# Patient Record
Sex: Male | Born: 1965 | Race: White | Hispanic: No | Marital: Married | Smoking: Former smoker
Health system: Southern US, Community
[De-identification: ages and names within clinical notes are randomized; demographics above are authoritative.]

---

## 2004-08-07 ENCOUNTER — Emergency Department (HOSPITAL_COMMUNITY): Admission: EM | Admit: 2004-08-07 | Discharge: 2004-08-07 | Payer: Self-pay | Admitting: Emergency Medicine

## 2005-08-05 ENCOUNTER — Emergency Department (HOSPITAL_COMMUNITY): Admission: EM | Admit: 2005-08-05 | Discharge: 2005-08-05 | Payer: Self-pay | Admitting: Emergency Medicine

## 2008-01-12 ENCOUNTER — Ambulatory Visit: Payer: Self-pay | Admitting: Family Medicine

## 2008-01-12 ENCOUNTER — Encounter: Admission: RE | Admit: 2008-01-12 | Discharge: 2008-01-12 | Payer: Self-pay | Admitting: Family Medicine

## 2010-11-16 ENCOUNTER — Inpatient Hospital Stay (INDEPENDENT_AMBULATORY_CARE_PROVIDER_SITE_OTHER)
Admission: RE | Admit: 2010-11-16 | Discharge: 2010-11-16 | Disposition: A | Discharge status: Home / Self Care | Payer: BC Managed Care – PPO | Source: Ambulatory Visit | Attending: Family Medicine | Admitting: Family Medicine

## 2010-11-16 DIAGNOSIS — L738 Other specified follicular disorders: Secondary | ICD-10-CM

## 2010-11-16 DIAGNOSIS — L989 Disorder of the skin and subcutaneous tissue, unspecified: Secondary | ICD-10-CM

## 2011-03-22 ENCOUNTER — Inpatient Hospital Stay (INDEPENDENT_AMBULATORY_CARE_PROVIDER_SITE_OTHER)
Admission: RE | Admit: 2011-03-22 | Discharge: 2011-03-22 | Disposition: A | Discharge status: Home / Self Care | Payer: BC Managed Care – PPO | Source: Ambulatory Visit | Attending: Family Medicine | Admitting: Family Medicine

## 2011-03-22 DIAGNOSIS — J069 Acute upper respiratory infection, unspecified: Secondary | ICD-10-CM

## 2012-11-02 DIAGNOSIS — J302 Other seasonal allergic rhinitis: Secondary | ICD-10-CM | POA: Insufficient documentation

## 2013-08-20 DIAGNOSIS — F1420 Cocaine dependence, uncomplicated: Secondary | ICD-10-CM | POA: Insufficient documentation

## 2013-08-20 DIAGNOSIS — F1110 Opioid abuse, uncomplicated: Secondary | ICD-10-CM | POA: Insufficient documentation

## 2013-08-20 DIAGNOSIS — M1990 Unspecified osteoarthritis, unspecified site: Secondary | ICD-10-CM | POA: Insufficient documentation

## 2013-08-20 DIAGNOSIS — R0600 Dyspnea, unspecified: Secondary | ICD-10-CM | POA: Insufficient documentation

## 2013-09-08 DIAGNOSIS — G2581 Restless legs syndrome: Secondary | ICD-10-CM | POA: Insufficient documentation

## 2013-09-08 DIAGNOSIS — F339 Major depressive disorder, recurrent, unspecified: Secondary | ICD-10-CM | POA: Insufficient documentation

## 2016-12-16 ENCOUNTER — Emergency Department
Admission: EM | Admit: 2016-12-16 | Discharge: 2016-12-16 | Disposition: A | Discharge status: Home / Self Care | Payer: Managed Care, Other (non HMO) | Source: Home / Self Care | Attending: Family Medicine | Admitting: Family Medicine

## 2016-12-16 ENCOUNTER — Encounter: Payer: Self-pay | Admitting: Emergency Medicine

## 2016-12-16 DIAGNOSIS — N492 Inflammatory disorders of scrotum: Secondary | ICD-10-CM | POA: Diagnosis not present

## 2016-12-16 MED ORDER — DOXYCYCLINE HYCLATE 100 MG PO CAPS: 100.0000 mg | ORAL_CAPSULE | Freq: Two times a day (BID) | ORAL | 0 refills | Status: DC

## 2016-12-16 NOTE — ED Notes (Signed)
Patient placed in gown

## 2016-12-16 NOTE — Discharge Instructions (Signed)
°  Please take antibiotics as prescribed and be sure to complete entire course even if you start to feel better to ensure infection does not come back. ° °

## 2016-12-16 NOTE — ED Triage Notes (Signed)
Patient presents to [facility] with C/O a cyst in the right groin area times 1 week, he denies fever, or other problems.

## 2016-12-16 NOTE — ED Provider Notes (Signed)
CSN: [id number]     Arrival date & time 12/16/16  1110 History   First MD Initiated Contact with Patient 12/16/16 1136     Chief Complaint  Patient presents with  . Cyst   (Consider location/radiation/quality/duration/timing/severity/associated sxs/prior Treatment) HPI  Duane Hernandez is a 51 y.o. male presenting to UC with c/o 1 week gradually worsening painful cyst on Right side of scrotum.  He notes he has had a small hard nodule in same area for many months but only within the last week has it become painful and felt like it is getting bigger.  Pain is aching and sore, 2/10.  Denies fever, chills, n/v/d.    History reviewed. No pertinent past medical history. History reviewed. No pertinent surgical history. History reviewed. No pertinent family history. Social History  Substance Use Topics  . Smoking status: Current Every Day Smoker  . Smokeless tobacco: Never Used  . Alcohol use No    Review of Systems  Constitutional: Negative for chills and fever.  Gastrointestinal: Negative for diarrhea, nausea and vomiting.  Genitourinary: Positive for scrotal swelling. Negative for discharge, penile pain and testicular pain.  Skin: Negative for wound. Color change:  unsure, he cannot see area on his scrotum.    Allergies  Patient has no allergy information on record.  Home Medications   Prior to Admission medications   Medication Sig Start Date End Date Taking? Authorizing Provider  doxycycline (VIBRAMYCIN) 100 MG capsule Take 1 capsule (100 mg total) by mouth 2 (two) times daily. One po bid x 7 days 12/16/16   Lurene ShadowPhelps, Dannelle Rhymes O, PA-C   Meds Ordered and Administered this Visit  Medications - No data to display  BP 117/77 (BP Location: Left Arm)   Pulse 61   Temp 98.4 F (36.9 C) (Oral)   Resp 16   Ht 6\' 1"  (1.854 m)   Wt 190 lb 8 oz (86.4 kg)   SpO2 96%   BMI 25.13 kg/m  No data found.   Physical Exam  Constitutional: He is oriented to person, place, and time. He appears  well-developed and well-nourished.  HENT:  Head: Normocephalic and atraumatic.  Eyes: EOM are normal.  Neck: Normal range of motion.  Cardiovascular: Normal rate.   Pulmonary/Chest: Effort normal.  Genitourinary: Right testis shows mass and tenderness.     Genitourinary Comments: Chaperoned exam. Quarter sized area of erythema and induration with centralized white head and fluctuance. No active bleeding or drainage. No red streaking.   Musculoskeletal: Normal range of motion.  Neurological: He is alert and oriented to person, place, and time.  Skin: Skin is warm and dry.  Psychiatric: He has a normal mood and affect. His behavior is normal.  Nursing note and vitals reviewed.   Urgent Care Course     .Marland Kitchen.Incision and Drainage Date/Time: 12/16/2016 12:46 PM Performed by: Lurene ShadowPHELPS, Elisabet Gutzmer O Authorized by: Donna ChristenBEESE, STEPHEN A   Consent:    Consent obtained:  Verbal   Consent given by:  Patient   Risks discussed:  Bleeding, incomplete drainage and pain   Alternatives discussed:  Delayed treatment and alternative treatment (antibiotics and warm compress only) Location:    Type:  Abscess   Size:  2.5   Location:  Anogenital   Anogenital location:  Scrotal wall (Right side) Pre-procedure details:    Skin preparation:  Betadine Anesthesia (see MAR for exact dosages):    Anesthesia method:  Local infiltration   Local anesthetic:  Lidocaine 1% w/o epi Procedure type:  Complexity:  Simple Procedure details:    Incision types:  Single straight   Incision depth:  Subcutaneous   Scalpel blade:  11   Wound management:  Probed and deloculated and irrigated with saline   Drainage:  Purulent and bloody   Drainage amount:  Moderate   Wound treatment:  Wound left open   Packing materials:  None Post-procedure details:    Patient tolerance of procedure:  Tolerated well, no immediate complications   Wound culture sent to lab  Labs Review Labs Reviewed  WOUND CULTURE    Imaging  Review No results found.   MDM   1. Abscess of scrotal wall    Abscess of Right side scrotum  I&D successfully performed w/o immediate complication Culture sent  Rx: Doxycycline Pt declined tramadol   May take ibuprofen this evening if pain worsening. Encouraged warm compresses F/u with PCP as he may need referral to dermatologist or general surgeon if abscess originated from cyst to help prevent recurrence.     Lurene Shadow, PA-C 12/16/16 1249

## 2016-12-18 ENCOUNTER — Telehealth: Payer: Self-pay | Admitting: Emergency Medicine

## 2016-12-18 LAB — WOUND CULTURE: Gram Stain: NONE SEEN

## 2016-12-26 ENCOUNTER — Encounter: Payer: Self-pay | Admitting: Osteopathic Medicine

## 2016-12-26 ENCOUNTER — Ambulatory Visit (INDEPENDENT_AMBULATORY_CARE_PROVIDER_SITE_OTHER): Payer: Managed Care, Other (non HMO) | Admitting: Osteopathic Medicine

## 2016-12-26 VITALS — BP 120/72 | HR 59 | Ht 73.0 in | Wt 186.0 lb

## 2016-12-26 DIAGNOSIS — R5383 Other fatigue: Secondary | ICD-10-CM

## 2016-12-26 DIAGNOSIS — F1110 Opioid abuse, uncomplicated: Secondary | ICD-10-CM | POA: Diagnosis not present

## 2016-12-26 DIAGNOSIS — Z8619 Personal history of other infectious and parasitic diseases: Secondary | ICD-10-CM

## 2016-12-26 DIAGNOSIS — F1420 Cocaine dependence, uncomplicated: Secondary | ICD-10-CM | POA: Diagnosis not present

## 2016-12-26 DIAGNOSIS — L723 Sebaceous cyst: Secondary | ICD-10-CM

## 2016-12-26 LAB — LIPID PANEL
CHOL/HDL RATIO: 5.3 ratio — AB (ref <5.0)
Cholesterol: 205 mg/dL — ABNORMAL HIGH (ref <200)
HDL: 39 mg/dL — AB (ref >40)
LDL CALC: 149 mg/dL — AB (ref <100)
TRIGLYCERIDES: 87 mg/dL (ref <150)
VLDL: 17 mg/dL (ref <30)

## 2016-12-26 LAB — COMPLETE METABOLIC PANEL WITH GFR
ALT: 30 U/L (ref 9–46)
AST: 22 U/L (ref 10–35)
Albumin: 4.6 g/dL (ref 3.6–5.1)
Alkaline Phosphatase: 48 U/L (ref 40–115)
BILIRUBIN TOTAL: 0.8 mg/dL (ref 0.2–1.2)
BUN: 17 mg/dL (ref 7–25)
CO2: 27 mmol/L (ref 20–31)
Calcium: 9.3 mg/dL (ref 8.6–10.3)
Chloride: 105 mmol/L (ref 98–110)
Creat: 0.91 mg/dL (ref 0.70–1.33)
Glucose, Bld: 92 mg/dL (ref 65–99)
POTASSIUM: 4.3 mmol/L (ref 3.5–5.3)
Sodium: 141 mmol/L (ref 135–146)
TOTAL PROTEIN: 7.2 g/dL (ref 6.1–8.1)

## 2016-12-26 LAB — TSH: TSH: 1.89 mIU/L (ref 0.40–4.50)

## 2016-12-26 LAB — CBC
HEMATOCRIT: 47.1 % (ref 38.5–50.0)
HEMOGLOBIN: 16.1 g/dL (ref 13.2–17.1)
MCH: 33.3 pg — ABNORMAL HIGH (ref 27.0–33.0)
MCHC: 34.2 g/dL (ref 32.0–36.0)
MCV: 97.5 fL (ref 80.0–100.0)
MPV: 12 fL (ref 7.5–12.5)
PLATELETS: 172 10*3/uL (ref 140–400)
RBC: 4.83 MIL/uL (ref 4.20–5.80)
RDW: 13.1 % (ref 11.0–15.0)
WBC: 5.9 10*3/uL (ref 3.8–10.8)

## 2016-12-26 MED ORDER — DOXYCYCLINE HYCLATE 100 MG PO CAPS: 100.0000 mg | ORAL_CAPSULE | Freq: Two times a day (BID) | ORAL | 0 refills | Status: DC

## 2016-12-26 MED ORDER — DOXYCYCLINE HYCLATE 100 MG PO TABS: 100.0000 mg | ORAL_TABLET | Freq: Two times a day (BID) | ORAL | 0 refills | Status: DC

## 2016-12-26 NOTE — Patient Instructions (Signed)
Plan:  Keep area clean and dry! Wear maxi pad right up against the skin to promote air circulation and to avoid moisture accumulation  The packing should come out in 2-3 days. If it comes out on its own before then, don't worry about it  We are sending you to a urologist who can remove the cyst entirely and prevent this from coming back  Fill the antibiotics and call us if she skin becomes red/warm, if increased pus/drainage, of fever or other concerns

## 2016-12-26 NOTE — Progress Notes (Signed)
HPI: Duane Hernandez is a 51 y.o. male  who presents to [facility] today, 12/26/16,  for chief complaint of:  Chief Complaint  Patient presents with  . Establish Care    Cyst  . Context: recent I&D at urgent care 12/16/16 . Location: R scrotum  . Quality: starting to swell up and drain again . Duration: was present many months but worsening . Modifying factors: Doxy and ibuprofen . Assoc signs/symptoms: no fever  Hx Hep C - has gone through treatment for this. Would like testing to confirm that he has cleared the virus  Fatigue - would like lab work done   Fasting - we can do labs today   Smoker - recently underwent some kind of laster therapy for quitting, still struggling to stop. He is not interested in pharmacologic therapy such as Chantix or Wellbutrin.    Past medical, surgical, social and family history reviewed: Patient Active Problem List   Diagnosis Date Noted  . Restless leg syndrome 09/08/2013  . Major depression, recurrent (HCC) 09/08/2013  . Arthritis 08/20/2013  . PND (paroxysmal nocturnal dyspnea) 08/20/2013  . Opiate abuse, episodic 08/20/2013  . Cocaine use disorder, severe, dependence (HCC) 08/20/2013  . Seasonal allergies 11/02/2012   No past surgical history on file. Social History  Substance Use Topics  . Smoking status: Current Every Day Smoker  . Smokeless tobacco: Never Used  . Alcohol use No   No family history on file.   Current medication list and allergy/intolerance information reviewed:   Current Outpatient Prescriptions  Medication Sig Dispense Refill  . doxycycline (VIBRAMYCIN) 100 MG capsule Take 1 capsule (100 mg total) by mouth 2 (two) times daily. One po bid x 7 days 14 capsule 0   No current facility-administered medications for this visit.    Not on File    Review of Systems:  Constitutional:  No  fever, no chills, No recent illness, No unintentional weight changes. No significant  fatigue.   HEENT: No  headache, no vision change,  Cardiac: No  chest pain, No  pressure, No palpitations  Respiratory:  No  shortness of breath. No  Cough  Gastrointestinal: No  abdominal pain, No  nausea, No  vomiting,  No  blood in stool, No  diarrhea, No  constipation   Musculoskeletal: No new myalgia/arthralgia  Genitourinary: No  incontinence, No  abnormal genital bleeding, No abnormal genital discharge  Skin: No  Rash, +other wounds/concerning lesions  Hem/Onc: No  easy bruising/bleeding, No  abnormal lymph node   Endocrine: No cold intolerance,  No heat intolerance. No polyuria/polydipsia/polyphagia   Neurologic: No  weakness, No  dizziness  Psychiatric: No  concerns with depression, No  concerns with anxiety, No sleep problems, No mood problems  Exam:  BP 120/72   Pulse (!) 59   Ht 6\' 1"  (1.854 m)   Wt 186 lb (84.4 kg)   BMI 24.54 kg/m   Constitutional: VS see above. General Appearance: alert, well-developed, well-nourished, NAD  Eyes: Normal lids and conjunctive, non-icteric sclera  Ears, Nose, Mouth, Throat: MMM, Normal external inspection ears/nares/mouth/lips/gums.  Neck: No masses, trachea midline.   Gastrointestinal: Nontender, no masses.   Musculoskeletal: Gait normal. No clubbing/cyanosis of digits.   Neurological: Normal balance/coordination. No tremor.   Skin: warm, dry, intact. Mild drainage of thick whitish cheesy material from cystlike area at junction of right scrotum/right groin.  Psychiatric: Normal judgment/insight. Normal mood and affect. Oriented x3.   Records reviewed from urgent  care, wound culture at that time demonstrated corynebacterium species, no susceptibility testing available  PRE-OP DIAGNOSIS: Abscess versus sebaceous cyst POST-OP DIAGNOSIS: Likely sebaceous cyst PROCEDURE: incision and drainage  Performing Physician: Lyn HollingsheadAlexander    PROCEDURE:   Patient informed consent obtained verbally after discussion of risks  (including but not limited to pain, infection, bleeding, damage to surrounding tissues, incomplete evacuation/treatment of infection, recurrence)  and benefits (adequate treatment and diagnosis, relief of pain). All questions answered prior to procedure.   A timeout protocol was performed prior to initiating the procedure.  The area was prepared and draped in the usual, sterile manner. The site was anesthetized with 2 cc 1% lidocaine. A linear incision along the local skin lines was made and sebaceous material expressed. The cyst was explored thoroughly and sequestered pockets were evacuated. Bleeding was minimal.   Packing: iodoform packing   Followup: The patient tolerated the procedure well without complications. Standard post-procedure care is explained and return precautions are given, patient was provided with printed information & instructions.    ASSESSMENT/PLAN:   Sebaceous cyst - Plan: Ambulatory referral to Urology  History of hepatitis C - Plan: Hepatitis C RNA quantitative  Other fatigue - Plan: CBC, COMPLETE METABOLIC PANEL WITH GFR, Lipid panel, TSH  Cocaine use disorder, severe, dependence (HCC)  Opiate abuse, episodic    Patient Instructions  Plan:  Keep area clean and dry! Wear maxi pad right up against the skin to promote air circulation and to avoid moisture accumulation  The packing should come out in 2-3 days. If it comes out on its own before then, don't worry about it  We are sending you to a urologist who can remove the cyst entirely and prevent this from coming back  Fill the antibiotics and call us if she skin becomes red/warm, if increased pus/drainage, of fever or other concerns    Visit summary with medication list and pertinent instructions was printed for patient to review. All questions at time of visit were answered - patient instructed to contact office with any additional concerns. ER/RTC precautions were reviewed with the patient. Follow-up  plan: Return in about 6 weeks (around 02/06/2017) for annual physical and discuss lab results .

## 2016-12-28 LAB — HEPATITIS C RNA QUANTITATIVE
HCV Quantitative Log: <1.18 Log IU/mL
HCV Quantitative: <15 IU/mL

## 2017-01-26 ENCOUNTER — Emergency Department
Admission: EM | Admit: 2017-01-26 | Discharge: 2017-01-26 | Disposition: A | Discharge status: Home / Self Care | Payer: Managed Care, Other (non HMO) | Source: Home / Self Care | Attending: Family Medicine | Admitting: Family Medicine

## 2017-01-26 ENCOUNTER — Encounter: Payer: Self-pay | Admitting: Emergency Medicine

## 2017-01-26 DIAGNOSIS — S20469A Insect bite (nonvenomous) of unspecified back wall of thorax, initial encounter: Secondary | ICD-10-CM | POA: Diagnosis not present

## 2017-01-26 DIAGNOSIS — L089 Local infection of the skin and subcutaneous tissue, unspecified: Secondary | ICD-10-CM | POA: Diagnosis not present

## 2017-01-26 DIAGNOSIS — W57XXXA Bitten or stung by nonvenomous insect and other nonvenomous arthropods, initial encounter: Secondary | ICD-10-CM | POA: Diagnosis not present

## 2017-01-26 MED ORDER — DOXYCYCLINE HYCLATE 100 MG PO CAPS: 100.0000 mg | ORAL_CAPSULE | Freq: Two times a day (BID) | ORAL | 0 refills | Status: AC

## 2017-01-26 NOTE — Discharge Instructions (Signed)
Apply heating pad 2 or 3 times daily.

## 2017-01-26 NOTE — ED Provider Notes (Signed)
[facility]    CSN: [id number] Arrival date & time: 01/26/17  1604     History   Chief Complaint Chief Complaint  Patient presents with  . Insect Bite    HPI Duane Hernandez is a 51 y.o. male.   Two days ago while at work, patient felt a stinging sensation on his back suggestive of an insect bite.  Since then he has had a persistent and expanding area of itching/redness on his back.  He feels well otherwise.    The history is provided by the patient.  Rash  Location: back. Quality: burning, dryness, itchiness, painful, redness and swelling   Quality: not blistering, not bruising, not draining, not peeling, not scaling and not weeping   Pain details:    Quality:  Aching and burning   Severity:  Mild   Onset quality:  Gradual   Duration:  2 days   Timing:  Constant   Progression:  Worsening Severity:  Mild Onset quality:  Sudden Duration:  2 days Timing:  Constant Progression:  Worsening Chronicity:  New Context: insect bite/sting   Relieved by:  Nothing Worsened by:  Nothing Ineffective treatments:  None tried Associated symptoms: no abdominal pain, no diarrhea, no fatigue, no fever, no headaches, no induration, no joint pain, no myalgias and no nausea     History reviewed. No pertinent past medical history.  Patient Active Problem List   Diagnosis Date Noted  . Restless leg syndrome 09/08/2013  . Major depression, recurrent (HCC) 09/08/2013  . Arthritis 08/20/2013  . PND (paroxysmal nocturnal dyspnea) 08/20/2013  . Opiate abuse, episodic 08/20/2013  . Cocaine use disorder, severe, dependence (HCC) 08/20/2013  . Seasonal allergies 11/02/2012    History reviewed. No pertinent surgical history.     Home Medications    Prior to Admission medications   Medication Sig Start Date End Date Taking? Authorizing Provider  doxycycline (VIBRAMYCIN) 100 MG capsule Take 1 capsule (100 mg total) by mouth 2 (two) times daily. Take with food. 01/26/17  02/02/17  Lattie Haw, MD    Family History Family History  Problem Relation Age of Onset  . Hyperlipidemia Mother   . Stroke Mother     Social History Social History  Substance Use Topics  . Smoking status: Current Every Day Smoker  . Smokeless tobacco: Never Used  . Alcohol use No     Allergies   Patient has no known allergies.   Review of Systems Review of Systems  Constitutional: Negative for fatigue and fever.  Gastrointestinal: Negative for abdominal pain, diarrhea and nausea.  Musculoskeletal: Negative for arthralgias and myalgias.  Skin: Positive for rash.  Neurological: Negative for headaches.  All other systems reviewed and are negative.    Physical Exam Triage Vital Signs ED Triage Vitals [01/26/17 1637]  Enc Vitals Group     BP 122/84     Pulse Rate (!) 51     Resp      Temp 98.1 F (36.7 C)     Temp Source Oral     SpO2 98 %     Weight 185 lb 12 oz (84.3 kg)     Height 6\' 1"  (1.854 m)     Head Circumference      Peak Flow      Pain Score 2     Pain Loc      Pain Edu?      Excl. in GC?    No data found.   Updated Vital  Signs BP 122/84 (BP Location: Left Arm)   Pulse (!) 51   Temp 98.1 F (36.7 C) (Oral)   Ht 6\' 1"  (1.854 m)   Wt 185 lb 12 oz (84.3 kg)   SpO2 98%   BMI 24.51 kg/m   Visual Acuity Right Eye Distance:   Left Eye Distance:   Bilateral Distance:    Right Eye Near:   Left Eye Near:    Bilateral Near:     Physical Exam  Constitutional: He appears well-developed and well-nourished. No distress.  HENT:  Head: Normocephalic.  Right Ear: External ear normal.  Left Ear: External ear normal.  Nose: Nose normal.  Mouth/Throat: Oropharynx is clear and moist.  Eyes: Pupils are equal, round, and reactive to light.  Neck: Neck supple.  Cardiovascular: Normal heart sounds.   Pulmonary/Chest: Breath sounds normal.      Posterior chest has 4cm diameter erythematous macular eruption as noted on diagram.  Mild  tenderness to palpation.  Lymphadenopathy:    He has no cervical adenopathy.  Neurological: He is alert.  Skin: Skin is warm and dry.  Nursing note and vitals reviewed.    UC Treatments / Results  Labs (all labs ordered are listed, but only abnormal results are displayed) Labs Reviewed - No data to display  EKG  EKG Interpretation None       Radiology No results found.  Procedures Procedures (including critical care time)  Medications Ordered in UC Medications - No data to display   Initial Impression / Assessment and Plan / UC Course  I have reviewed the triage vital signs and the nursing notes.  Pertinent labs & imaging results that were available during my care of the patient were reviewed by me and considered in my medical decision making (see chart for details).    Begin empiric doxycycline 100mg  BID. Apply heating pad 2 or 3 times daily. Followup with Family Doctor if not improved in 6 days.    Final Clinical Impressions(s) / UC Diagnoses   Final diagnoses:  Infected insect bite of back, initial encounter    New Prescriptions New Prescriptions   DOXYCYCLINE (VIBRAMYCIN) 100 MG CAPSULE    Take 1 capsule (100 mg total) by mouth 2 (two) times daily. Take with food.         Lattie Haw, MD 02/03/17 [phone]

## 2017-01-26 NOTE — ED Triage Notes (Signed)
Patient complaining of possible spider bite on back on Thursday while at work.  The area is red and some swelling, some pain.

## 2017-02-06 ENCOUNTER — Encounter: Payer: Managed Care, Other (non HMO) | Admitting: Osteopathic Medicine

## 2018-08-06 ENCOUNTER — Telehealth: Payer: Self-pay | Admitting: Osteopathic Medicine

## 2018-08-06 NOTE — Telephone Encounter (Signed)
Patient was seen at the [facility] in [place] on 08/03/2018 for chest pain. States that his pulse rate was low. Would like to come in for this as soon as possible. States that he wanted to get referred for a stress test. Please advise.

## 2018-08-06 NOTE — Telephone Encounter (Signed)
If he's concerned about chest pain, this is something he should strongly consider going to the ER about. Will get him in when we can here but we are not an emergency clinic

## 2018-08-07 ENCOUNTER — Ambulatory Visit: Payer: Managed Care, Other (non HMO) | Admitting: Osteopathic Medicine

## 2018-08-07 ENCOUNTER — Ambulatory Visit (INDEPENDENT_AMBULATORY_CARE_PROVIDER_SITE_OTHER): Payer: 59

## 2018-08-07 ENCOUNTER — Encounter: Payer: Self-pay | Admitting: Osteopathic Medicine

## 2018-08-07 VITALS — BP 119/73 | HR 53 | Temp 98.0°F | Wt 198.1 lb

## 2018-08-07 DIAGNOSIS — S43421A Sprain of right rotator cuff capsule, initial encounter: Secondary | ICD-10-CM | POA: Diagnosis not present

## 2018-08-07 DIAGNOSIS — M25511 Pain in right shoulder: Secondary | ICD-10-CM

## 2018-08-07 DIAGNOSIS — S46311A Strain of muscle, fascia and tendon of triceps, right arm, initial encounter: Secondary | ICD-10-CM | POA: Diagnosis not present

## 2018-08-07 DIAGNOSIS — M19011 Primary osteoarthritis, right shoulder: Secondary | ICD-10-CM

## 2018-08-07 MED ORDER — MELOXICAM 15 MG PO TABS: 15.0000 mg | ORAL_TABLET | Freq: Every day | ORAL | 0 refills | Status: DC

## 2018-08-07 MED ORDER — PREDNISONE 50 MG PO TABS: ORAL_TABLET | ORAL | 0 refills | Status: AC

## 2018-08-07 NOTE — Patient Instructions (Addendum)
If shoulder/arm is not better or if it gets worse, I would recommend follow-up with one of our sports medicine specialists (Dr Denyse Amass or Dr. Cherylann Parr Dr. Karie Schwalbe) for further evaluation in 2-4 weeks. Just call our office and ask for an appointment for sports medicine!   In the meantime, let's try anti-inflammatory Meloxicam and also a steroid burst w/ Prednisone.   Due for annual physical sometime this year, come see me!

## 2018-08-07 NOTE — Progress Notes (Signed)
HPI: Duane Hernandez is a 53 y.o. male who  has no past medical history on file.  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      At today's visit 08/07/18 ... PMH, PSH, FH reviewed and updated as needed.  Current medication list and allergy/intolerance hx reviewed and updated as needed. (See remainder of HPI, ROS, Phys Exam below)         ASSESSMENT/PLAN: The primary encounter diagnosis was Acute pain of right shoulder. Diagnoses of Sprain of right rotator cuff capsule, initial encounter and Strain of right triceps, initial encounter were also pertinent to this visit.   Orders Placed This Encounter  Procedures  . DG Shoulder Right   Declined XR other areas of concern     Meds ordered this encounter  Medications  . meloxicam (MOBIC) 15 MG tablet    Sig: Take 1 tablet (15 mg total) by mouth daily. Daily for 7 days then as needed after that    Dispense:  30 tablet    Refill:  0  . predniSONE (DELTASONE) 50 MG tablet    Sig: One tab PO daily for 5 days.    Dispense:  5 tablet    Refill:  0    Patient Instructions  If shoulder/arm is not better or if it gets worse, I would recommend follow-up with one of our sports medicine specialists (Dr Denyse Amass or Dr. Cherylann Parr Dr. Karie Schwalbe) for further evaluation in 2-4 weeks.  Just call our office and ask for an appointment for sports medicine!   In the meantime, let's try anti-inflammatory Meloxicam and also a steroid burst w/ Prednisone.   Due for annual physical sometime this year, come see me!       Follow-up plan: Return if symptoms worsen or fail to improve - see sports medicine .                                                 ################################################# ################################################# ################################################# #################################################    Current Meds  Medication Sig  . B Complex Vitamins (VITAMIN B-COMPLEX) TABS Take by mouth.  . IBUPROFEN IB PO Take by mouth.  Marland Kitchen KRILL OIL PO Take by mouth.    Allergies  Allergen Reactions  . Lithium     Other reaction(s): Other sedation  . Bupropion Anxiety       Review of Systems:  Constitutional: No recent illness  HEENT: No  headache, no vision change  Cardiac: +chest pain as per HPI, No  pressure, No palpitations  Respiratory:  No  shortness of breath. No  Cough  Gastrointestinal: No  abdominal pain  Musculoskeletal: +new myalgia/arthralgia  Skin: No  Rash  Neurologic: No  weakness, No  Dizziness  Exam:  BP 119/73 (BP Location: Left Arm, Patient Position: Sitting, Cuff Size: Normal)   Pulse (!) 53   Temp 98 F (36.7 C) (Oral)   Wt 198 lb 1.6 oz (89.9 kg)   SpO2 98%   BMI 26.14 kg/m   Constitutional: VS see above. General Appearance: alert, well-developed, well-nourished, NAD  Eyes: Normal lids and conjunctive, non-icteric sclera  Ears, Nose, Mouth, Throat: MMM, Normal external inspection ears/nares/mouth/lips/gums.  Neck: No masses, trachea midline.   Respiratory: Normal respiratory effort. no wheeze, no rhonchi, no rales  Cardiovascular: S1/S2 normal, no murmur, no rub/gallop auscultated. RRR.   Musculoskeletal: Gait normal.  Symmetric and independent movement of all extremities  R shoulder Neg drop arm, neg post apprehension, (+) anterior apprehension, neg cross-arm, neg speeds/yergason/neer  Neurological: Normal balance/coordination. No tremor.  Skin: warm, dry, intact.   Psychiatric: Normal judgment/insight. Normal mood and affect. Oriented x3.       Visit summary with medication list and pertinent instructions was printed for patient to review, patient was advised to alert [place] if any updates are needed. All questions at time of visit were answered - patient instructed to contact office with any additional concerns. ER/RTC precautions were reviewed with the patient and understanding verbalized.     Please note: voice recognition software was used to produce this document, and typos may escape review. Please contact Dr. Lyn Hollingshead for any needed clarifications.    Follow up plan: Return if symptoms worsen or fail to improve - see sports medicine .

## 2018-08-07 NOTE — Telephone Encounter (Signed)
Patient scheduled.

## 2018-08-08 ENCOUNTER — Ambulatory Visit: Payer: Managed Care, Other (non HMO) | Admitting: Osteopathic Medicine

## 2018-09-03 ENCOUNTER — Other Ambulatory Visit: Payer: Self-pay | Admitting: Osteopathic Medicine

## 2018-09-04 ENCOUNTER — Other Ambulatory Visit: Payer: Self-pay | Admitting: Osteopathic Medicine

## 2018-09-17 ENCOUNTER — Encounter: Payer: Self-pay | Admitting: Osteopathic Medicine

## 2019-03-01 ENCOUNTER — Emergency Department
Admission: EM | Admit: 2019-03-01 | Discharge: 2019-03-01 | Disposition: A | Discharge status: Home / Self Care | Payer: 59 | Source: Home / Self Care | Attending: Family Medicine | Admitting: Family Medicine

## 2019-03-01 ENCOUNTER — Other Ambulatory Visit: Payer: Self-pay

## 2019-03-01 DIAGNOSIS — Z23 Encounter for immunization: Secondary | ICD-10-CM

## 2019-03-01 DIAGNOSIS — S91332A Puncture wound without foreign body, left foot, initial encounter: Secondary | ICD-10-CM

## 2019-03-01 MED ORDER — TETANUS-DIPHTH-ACELL PERTUSSIS 5-2.5-18.5 LF-MCG/0.5 IM SUSP
0.5000 mL | Freq: Once | INTRAMUSCULAR | Status: AC
  Administered 2019-03-01: 0.5 mL via INTRAMUSCULAR

## 2019-03-01 MED ORDER — DOXYCYCLINE HYCLATE 100 MG PO CAPS: 100.0000 mg | ORAL_CAPSULE | Freq: Two times a day (BID) | ORAL | 0 refills | Status: DC

## 2019-03-01 NOTE — Discharge Instructions (Addendum)
Change dressing daily and apply Bacitracin ointment to wound until healed.  Keep wound clean and dry.  Return for any signs of infection (or follow-up with family doctor):  Increasing redness, swelling, pain, heat, drainage, etc.   May take Ibuprofen 200mg , 4 tabs every 8 hours with food, if needed.

## 2019-03-01 NOTE — ED Triage Notes (Signed)
Stepped on rusty nail about an hour ago. Wasn't sure if Tdap was up to date. Bleeding controlled.

## 2019-03-01 NOTE — ED Provider Notes (Signed)
[facility]    CSN: [id number] Arrival date & time: 03/01/19  1433      History   Chief Complaint Chief Complaint  Patient presents with  . Stepped on rusty nail    LT foot    HPI Duane Hernandez is a 53 y.o. male.   Patient stepped on a rusty nail about three hours ago.  The nail penetrated his shoe and punctured the plantar surface of his foot.  He had no difficulty removing the nail.  He does not remember his last Tdap.  The history is provided by the patient.  Foot Injury Location:  Foot Time since incident:  3 hours Injury: yes   Mechanism of injury comment:  Puncture wound Foot location:  L foot Pain details:    Quality:  Aching   Radiates to:  Does not radiate   Severity:  Mild   Onset quality:  Sudden   Duration:  3 hours   Timing:  Constant   Progression:  Unchanged Chronicity:  New Foreign body present:  No foreign bodies Tetanus status:  Out of date Prior injury to area:  No Relieved by:  None tried Worsened by:  Bearing weight Ineffective treatments:  None tried Associated symptoms: no decreased ROM, no numbness, no swelling and no tingling     History reviewed. No pertinent past medical history.  Patient Active Problem List   Diagnosis Date Noted  . Restless leg syndrome 09/08/2013  . Major depression, recurrent (HCC) 09/08/2013  . Arthritis 08/20/2013  . PND (paroxysmal nocturnal dyspnea) 08/20/2013  . Opiate abuse, episodic (HCC) 08/20/2013  . Cocaine use disorder, severe, dependence (HCC) 08/20/2013  . Seasonal allergies 11/02/2012    History reviewed. No pertinent surgical history.     Home Medications    Prior to Admission medications   Medication Sig Start Date End Date Taking? Authorizing Provider  B Complex Vitamins (VITAMIN B-COMPLEX) TABS Take by mouth.    [provider]  doxycycline (VIBRAMYCIN) 100 MG capsule Take 1 capsule (100 mg total) by mouth 2 (two) times daily. Take with food. 03/01/19   Lattie Haw, MD  IBUPROFEN IB PO Take by mouth.    [provider]  KRILL OIL PO Take by mouth.    [provider]  meloxicam (MOBIC) 15 MG tablet TAKE 1 TABLET(15 MG) BY MOUTH DAILY FOR 7 DAYS THEN AS NEEDED AFTER THAT 09/04/18   Sunnie Nielsen, DO  predniSONE (DELTASONE) 50 MG tablet One tab PO daily for 5 days. 08/07/18   Sunnie Nielsen, DO    Family History Family History  Problem Relation Age of Onset  . Hyperlipidemia Mother   . Stroke Mother     Social History Social History   Tobacco Use  . Smoking status: Former Smoker    Types: Cigarettes    Quit date: 01/02/2018    Years since quitting: 1.1  . Smokeless tobacco: Never Used  Substance Use Topics  . Alcohol use: No  . Drug use: No     Allergies   Lithium and Bupropion   Review of Systems Review of Systems  All other systems reviewed and are negative.    Physical Exam Triage Vital Signs ED Triage Vitals  Enc Vitals Group     BP 03/01/19 1455 121/81     Pulse Rate 03/01/19 1455 79     Resp --      Temp --      Temp src --  SpO2 03/01/19 1455 98 %     Weight 03/01/19 1457 175 lb (79.4 kg)     Height 03/01/19 1457 6\' 1"  (1.854 m)     Head Circumference --      Peak Flow --      Pain Score 03/01/19 1456 1     Pain Loc --      Pain Edu? --      Excl. in GC? --    No data found.  Updated Vital Signs BP 121/81 (BP Location: Left Arm)   Pulse 79   Ht 6\' 1"  (1.854 m)   Wt 79.4 kg   SpO2 98%   BMI 23.09 kg/m   Visual Acuity Right Eye Distance:   Left Eye Distance:   Bilateral Distance:    Right Eye Near:   Left Eye Near:    Bilateral Near:     Physical Exam Vitals signs and nursing note reviewed.  Constitutional:      General: He is not in acute distress. Eyes:     Pupils: Pupils are equal, round, and reactive to light.  Cardiovascular:     Rate and Rhythm: Normal rate.  Pulmonary:     Effort: Pulmonary effort is normal.  Musculoskeletal:     Right lower leg:  No edema.     Left lower leg: No edema.     Left foot: Normal range of motion. No bony tenderness or swelling.       Feet:     Comments: Left foot has a 3mm diameter puncture wound plantar surface as noted on diagram.  There is no swelling or erythema, and minimal tenderness to palpation.  All toes have full range of motion.  Distal neurovascular function is intact.   Skin:    General: Skin is warm and dry.  Neurological:     General: No focal deficit present.     Mental Status: He is alert.      UC Treatments / Results  Labs (all labs ordered are listed, but only abnormal results are displayed) Labs Reviewed - No data to display  EKG   Radiology No results found.  Procedures Procedures (including critical care time)  Medications Ordered in UC Medications  Tdap (BOOSTRIX) injection 0.5 mL (0.5 mLs Intramuscular Given 03/01/19 1528)    Initial Impression / Assessment and Plan / UC Course  I have reviewed the triage vital signs and the nursing notes.  Pertinent labs & imaging results that were available during my care of the patient were reviewed by me and considered in my medical decision making (see chart for details).    Administered Tdap.  Begin empiric doxycycline. Followup with Family Doctor if not improved in about 5 days.   Final Clinical Impressions(s) / UC Diagnoses   Final diagnoses:  Puncture wound of left foot, initial encounter     Discharge Instructions     Change dressing daily and apply Bacitracin ointment to wound until healed.  Keep wound clean and dry.  Return for any signs of infection (or follow-up with family doctor):  Increasing redness, swelling, pain, heat, drainage, etc.   May take Ibuprofen 200mg , 4 tabs every 8 hours with food, if needed.     ED Prescriptions    Medication Sig Dispense Auth. Provider   doxycycline (VIBRAMYCIN) 100 MG capsule Take 1 capsule (100 mg total) by mouth 2 (two) times daily. Take with food. 14 capsule  Lattie HawBeese,  A, MD        Cathren HarshBeese,  Ishmael Holter, MD 03/03/19 1538

## 2020-07-27 IMAGING — DX DG SHOULDER 2+V*R*
3 series · 3 of 3 positions shown · non-contrast
Comparison: None.

CLINICAL DATA: 52 year-old male c/o RIGHT shoulder pain x a few
weeks. Pt states he changed trucks at work with a shorter shifter.
Pt denies injury or sx. shoulder and upper arm pain

EXAM:
RIGHT SHOULDER - 2+ VIEW

[shoulder grashey]
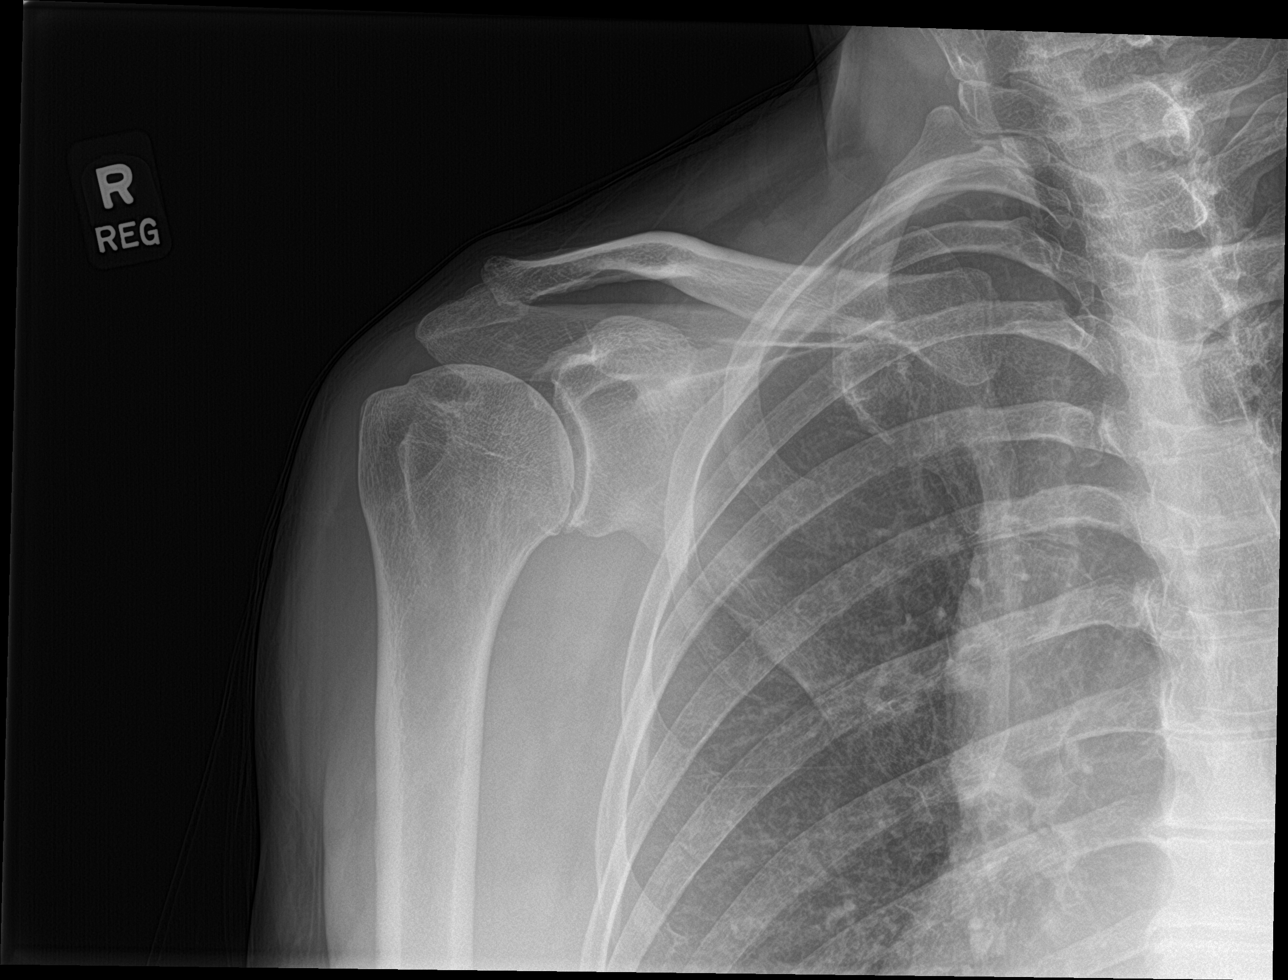

[shoulder y view]
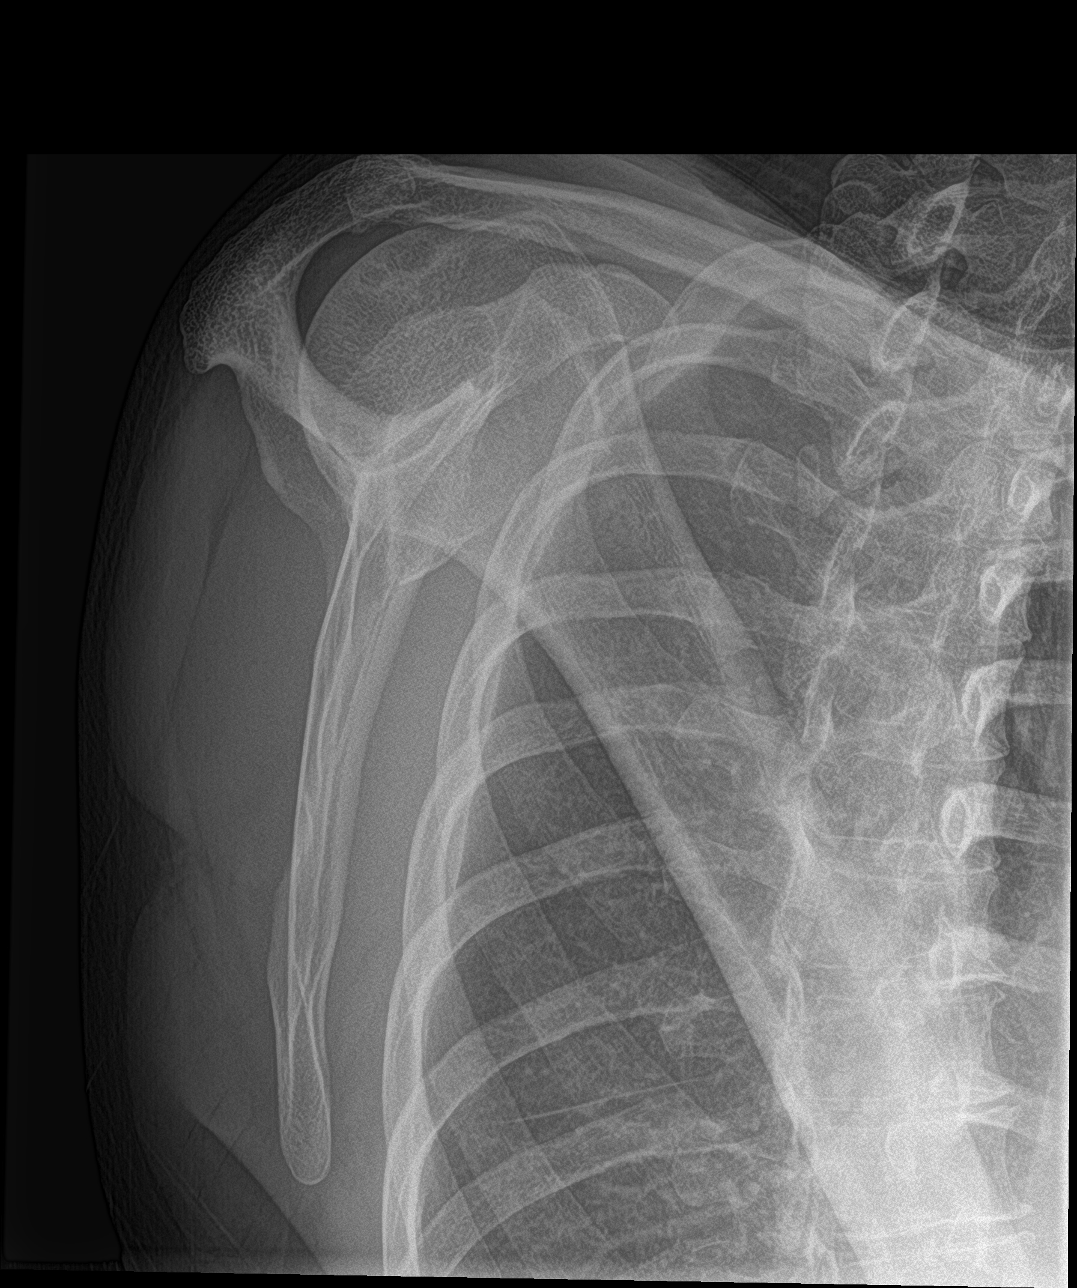

[shoulder axillary]
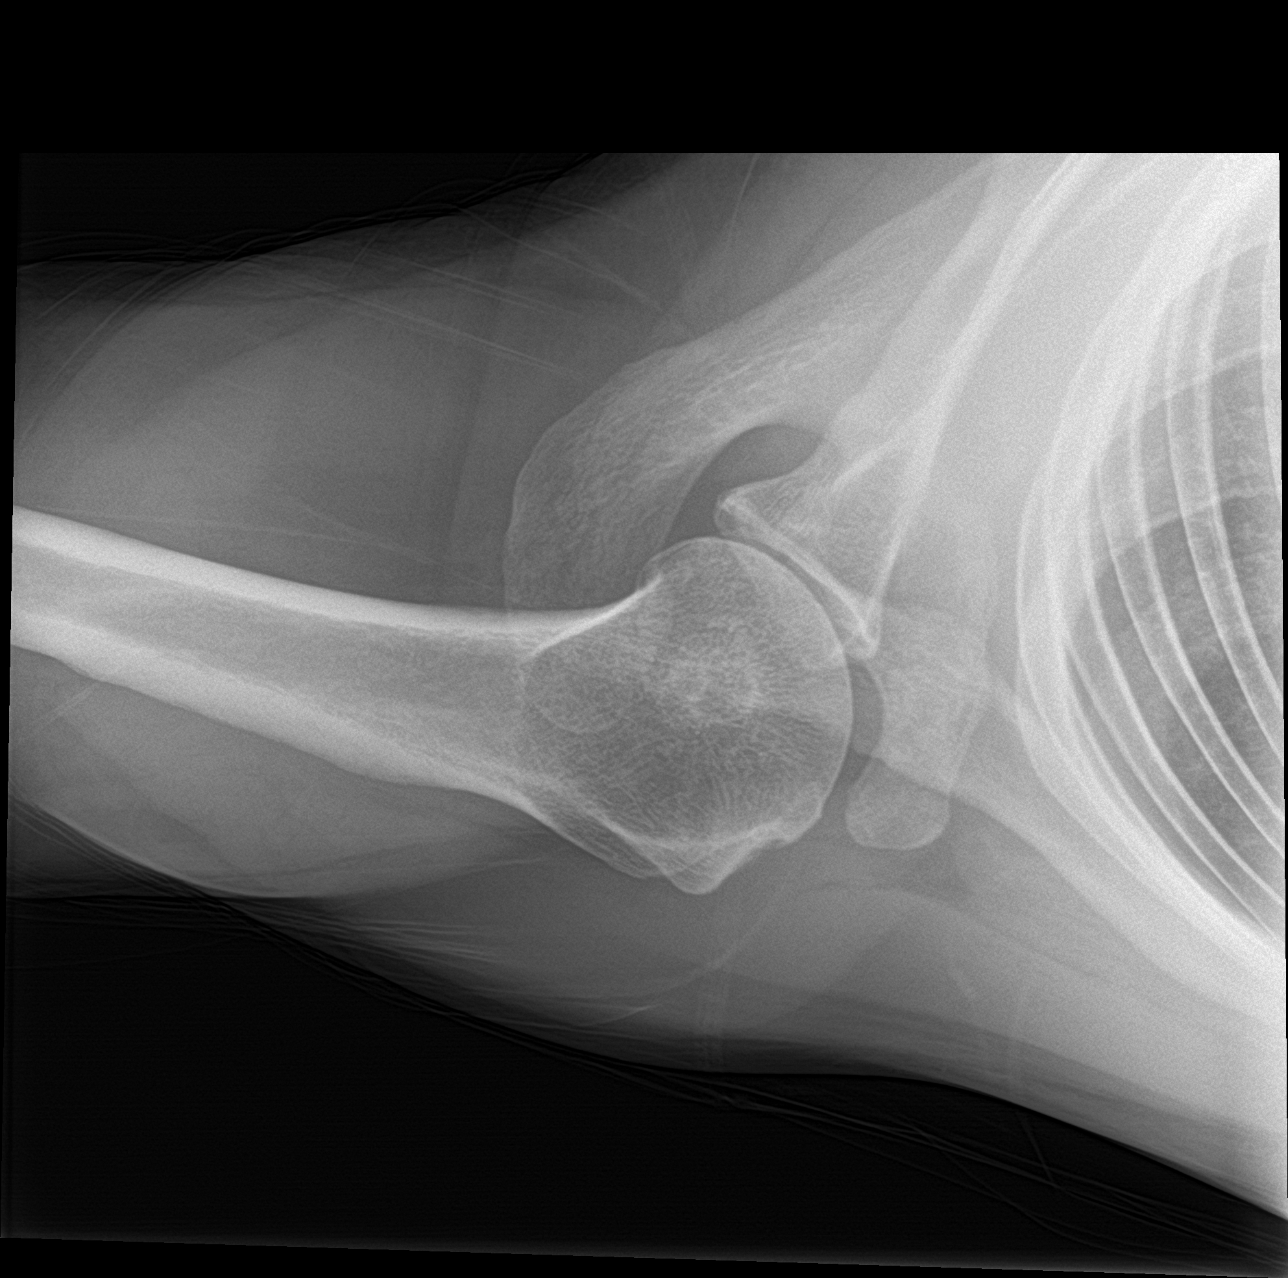

[3 of 3 positions shown; findings below may reference images not displayed]

FINDINGS: Narrowing of the glenohumeral joint. Mild sclerosis of the inferior
aspect the glenoid fossa. Very mild osteophytosis along the inferior
glenoid fossa.
IMPRESSION: Mild osteoarthritis of the RIGHT shoulder

## 2021-02-17 ENCOUNTER — Encounter: Payer: Self-pay | Admitting: Emergency Medicine

## 2021-02-17 ENCOUNTER — Other Ambulatory Visit: Payer: Self-pay

## 2021-02-17 ENCOUNTER — Emergency Department
Admission: EM | Admit: 2021-02-17 | Discharge: 2021-02-17 | Disposition: A | Discharge status: Home / Self Care | Payer: 59 | Source: Home / Self Care | Attending: Family Medicine | Admitting: Family Medicine

## 2021-02-17 DIAGNOSIS — B009 Herpesviral infection, unspecified: Secondary | ICD-10-CM

## 2021-02-17 MED ORDER — DOXYCYCLINE HYCLATE 100 MG PO CAPS: 100.0000 mg | ORAL_CAPSULE | Freq: Two times a day (BID) | ORAL | 0 refills | Status: AC

## 2021-02-17 MED ORDER — VALACYCLOVIR HCL 1 G PO TABS: ORAL_TABLET | ORAL | 1 refills | Status: AC

## 2021-02-17 NOTE — ED Triage Notes (Signed)
Patient c/o cold sore inside of nose for a couple of days.  Tried OTC meds w/o relief.

## 2021-02-17 NOTE — Discharge Instructions (Addendum)
If symptoms become significantly worse during the night or over the weekend, proceed to the local emergency room.  

## 2021-02-17 NOTE — ED Provider Notes (Signed)
[facility]    CSN: [id number] Arrival date & time: 02/17/21  1756      History   Chief Complaint Chief Complaint  Patient presents with   Cold Sore     HPI Duane Hernandez is a 55 y.o. male.   Patient has a history of cold sores, recurring on his lips about every 3 to 4 months.  Two days ago he developed some irritation at the lateral aspect of his right nares that he now believes is a recurrent cold sore.  He denies significant swelling or pain of his nose.    History reviewed. No pertinent past medical history.  Patient Active Problem List   Diagnosis Date Noted   Restless leg syndrome 09/08/2013   Major depression, recurrent (HCC) 09/08/2013   Arthritis 08/20/2013   PND (paroxysmal nocturnal dyspnea) 08/20/2013   Opiate abuse, episodic (HCC) 08/20/2013   Cocaine use disorder, severe, dependence (HCC) 08/20/2013   Seasonal allergies 11/02/2012    History reviewed. No pertinent surgical history.     Home Medications    Prior to Admission medications   Medication Sig Start Date End Date Taking? Authorizing Provider  B Complex Vitamins (VITAMIN B-COMPLEX) TABS Take by mouth.   Yes [provider]  IBUPROFEN IB PO Take by mouth.   Yes [provider]  valACYclovir (VALTREX) 1000 MG tablet Take two tabs PO, twice daily for one day.  Begin at first sign of a cold sore 02/17/21  Yes Lattie Haw, MD  doxycycline (VIBRAMYCIN) 100 MG capsule Take 1 capsule (100 mg total) by mouth 2 (two) times daily. Take with food. 02/17/21   Lattie Haw, MD  KRILL OIL PO Take by mouth.    [provider]  meloxicam (MOBIC) 15 MG tablet TAKE 1 TABLET(15 MG) BY MOUTH DAILY FOR 7 DAYS THEN AS NEEDED AFTER THAT 09/04/18   Sunnie Nielsen, DO  predniSONE (DELTASONE) 50 MG tablet One tab PO daily for 5 days. 08/07/18   Sunnie Nielsen, DO    Family History Family History  Problem Relation Age of Onset   Hyperlipidemia Mother    Stroke  Mother     Social History Social History   Tobacco Use   Smoking status: Former    Types: Cigarettes    Quit date: 01/02/2018    Years since quitting: 3.1   Smokeless tobacco: Never  Substance Use Topics   Alcohol use: No   Drug use: No     Allergies   Lithium and Bupropion   Review of Systems Review of Systems  Constitutional:  Negative for activity change, appetite change, chills, diaphoresis, fatigue and fever.  Skin:  Positive for rash.  All other systems reviewed and are negative.   Physical Exam Triage Vital Signs ED Triage Vitals [02/17/21 1817]  Enc Vitals Group     BP (!) 147/80     Pulse Rate 62     Resp      Temp 98.4 F (36.9 C)     Temp Source Oral     SpO2 97 %     Weight 170 lb (77.1 kg)     Height 6\' 1"  (1.854 m)     Head Circumference      Peak Flow      Pain Score 2     Pain Loc      Pain Edu?      Excl. in GC?    No data found.  Updated Vital Signs BP )  147/80 (BP Location: Right Arm)   Pulse 62   Temp 98.4 F (36.9 C) (Oral)   Ht 6\' 1"  (1.854 m)   Wt 77.1 kg   SpO2 97%   BMI 22.43 kg/m   Visual Acuity Right Eye Distance:   Left Eye Distance:   Bilateral Distance:    Right Eye Near:   Left Eye Near:    Bilateral Near:     Physical Exam Vitals and nursing note reviewed.  Constitutional:      General: He is not in acute distress.    Appearance: He is not ill-appearing.  HENT:     Head: Normocephalic.     Right Ear: External ear normal.     Left Ear: External ear normal.     Nose: No congestion.      Comments: Lateral edge of right nares has a 4 to 8mm diameter area of superficial excoriation without swelling. There is minimal erythema present.    Mouth/Throat:     Pharynx: Oropharynx is clear.  Eyes:     Conjunctiva/sclera: Conjunctivae normal.     Pupils: Pupils are equal, round, and reactive to light.  Cardiovascular:     Rate and Rhythm: Normal rate.  Pulmonary:     Effort: Pulmonary effort is normal.   Lymphadenopathy:     Cervical: No cervical adenopathy.  Skin:    General: Skin is warm and dry.  Neurological:     Mental Status: He is alert.    UC Treatments / Results  Labs (all labs ordered are listed, but only abnormal results are displayed) Labs Reviewed - No data to display  EKG   Radiology No results found.  Procedures Procedures (including critical care time)  Medications Ordered in UC Medications - No data to display  Initial Impression / Assessment and Plan / UC Course  I have reviewed the triage vital signs and the nursing notes.  Pertinent labs & imaging results that were available during my care of the patient were reviewed by me and considered in my medical decision making (see chart for details).    Rx for Valtrex (2 doses). As a precaution, will begin empiric doxycycline. Followup with Family Doctor if not improved in about 5 days.  Final Clinical Impressions(s) / UC Diagnoses   Final diagnoses:  Herpes simplex infection     Discharge Instructions      If symptoms become significantly worse during the night or over the weekend, proceed to the local emergency room.      ED Prescriptions     Medication Sig Dispense Auth. Provider   valACYclovir (VALTREX) 1000 MG tablet Take two tabs PO, twice daily for one day.  Begin at first sign of a cold sore 4 tablet 4m, MD   doxycycline (VIBRAMYCIN) 100 MG capsule Take 1 capsule (100 mg total) by mouth 2 (two) times daily. Take with food. 14 capsule Lattie Haw, MD         Lattie Haw, MD 02/19/21 [phone]
